# Patient Record
Sex: Male | Born: 1985 | Race: Black or African American | Hispanic: No | Marital: Single | Smoking: Current every day smoker
Health system: Southern US, Community
[De-identification: ages and names within clinical notes are randomized; demographics above are authoritative.]

## PROBLEM LIST (undated history)

## (undated) DIAGNOSIS — M069 Rheumatoid arthritis, unspecified: Secondary | ICD-10-CM

---

## 2013-03-29 ENCOUNTER — Emergency Department (HOSPITAL_COMMUNITY)
Admission: EM | Admit: 2013-03-29 | Discharge: 2013-03-29 | Discharge status: Against Medical Advice | Payer: Self-pay | Attending: Emergency Medicine | Admitting: Emergency Medicine

## 2013-03-29 ENCOUNTER — Encounter (HOSPITAL_COMMUNITY): Payer: Self-pay | Admitting: Emergency Medicine

## 2013-03-29 DIAGNOSIS — F17200 Nicotine dependence, unspecified, uncomplicated: Secondary | ICD-10-CM | POA: Insufficient documentation

## 2013-03-29 DIAGNOSIS — M545 Low back pain, unspecified: Secondary | ICD-10-CM | POA: Insufficient documentation

## 2013-03-29 NOTE — ED Notes (Signed)
Pt did not answer x 1 

## 2013-03-29 NOTE — ED Notes (Signed)
Pt did not answer x 3 

## 2013-03-29 NOTE — ED Notes (Signed)
Patient presents about a week ago he started having back pain in the top of his back and now to the lower back

## 2013-03-29 NOTE — ED Notes (Signed)
Pt did not answer x 2 

## 2013-05-20 ENCOUNTER — Encounter (HOSPITAL_BASED_OUTPATIENT_CLINIC_OR_DEPARTMENT_OTHER): Payer: Self-pay | Admitting: Emergency Medicine

## 2013-05-20 ENCOUNTER — Emergency Department (HOSPITAL_BASED_OUTPATIENT_CLINIC_OR_DEPARTMENT_OTHER)
Admission: EM | Admit: 2013-05-20 | Discharge: 2013-05-20 | Disposition: A | Discharge status: Home / Self Care | Payer: Self-pay | Attending: Emergency Medicine | Admitting: Emergency Medicine

## 2013-05-20 ENCOUNTER — Emergency Department (HOSPITAL_BASED_OUTPATIENT_CLINIC_OR_DEPARTMENT_OTHER): Payer: Self-pay

## 2013-05-20 DIAGNOSIS — Z79899 Other long term (current) drug therapy: Secondary | ICD-10-CM | POA: Insufficient documentation

## 2013-05-20 DIAGNOSIS — F17200 Nicotine dependence, unspecified, uncomplicated: Secondary | ICD-10-CM | POA: Insufficient documentation

## 2013-05-20 DIAGNOSIS — M25429 Effusion, unspecified elbow: Secondary | ICD-10-CM | POA: Insufficient documentation

## 2013-05-20 DIAGNOSIS — M25469 Effusion, unspecified knee: Secondary | ICD-10-CM | POA: Insufficient documentation

## 2013-05-20 DIAGNOSIS — M12839 Other specific arthropathies, not elsewhere classified, unspecified wrist: Secondary | ICD-10-CM | POA: Insufficient documentation

## 2013-05-20 DIAGNOSIS — M12869 Other specific arthropathies, not elsewhere classified, unspecified knee: Secondary | ICD-10-CM | POA: Insufficient documentation

## 2013-05-20 LAB — URINALYSIS, ROUTINE W REFLEX MICROSCOPIC
Bilirubin Urine: NEGATIVE
GLUCOSE, UA: NEGATIVE mg/dL
Hgb urine dipstick: NEGATIVE
Ketones, ur: NEGATIVE mg/dL
NITRITE: NEGATIVE
Protein, ur: NEGATIVE mg/dL
Specific Gravity, Urine: 1.024 (ref 1.005–1.030)
Urobilinogen, UA: 0.2 mg/dL (ref 0.0–1.0)
pH: 6 (ref 5.0–8.0)

## 2013-05-20 LAB — URINE MICROSCOPIC-ADD ON

## 2013-05-20 LAB — C-REACTIVE PROTEIN: CRP: 4.1 mg/dL — ABNORMAL HIGH (ref <0.60)

## 2013-05-20 LAB — SEDIMENTATION RATE: Sed Rate: 68 mm/hr — ABNORMAL HIGH (ref 0–16)

## 2013-05-20 MED ORDER — METHOCARBAMOL 500 MG PO TABS: 500.0000 mg | ORAL_TABLET | Freq: Two times a day (BID) | ORAL | Status: DC

## 2013-05-20 MED ORDER — IBUPROFEN 600 MG PO TABS: 600.0000 mg | ORAL_TABLET | Freq: Four times a day (QID) | ORAL | Status: DC | PRN

## 2013-05-20 MED ORDER — HYDROCODONE-ACETAMINOPHEN 5-325 MG PO TABS: 1.0000 | ORAL_TABLET | Freq: Four times a day (QID) | ORAL | Status: DC | PRN

## 2013-05-20 MED ORDER — IBUPROFEN 400 MG PO TABS
600.0000 mg | ORAL_TABLET | Freq: Once | ORAL | Status: AC
  Administered 2013-05-20: 600 mg via ORAL

## 2013-05-20 MED ORDER — IBUPROFEN 400 MG PO TABS
ORAL_TABLET | ORAL | Status: AC
  Filled 2013-05-20: qty 1

## 2013-05-20 MED ORDER — IBUPROFEN 200 MG PO TABS
ORAL_TABLET | ORAL | Status: AC
  Filled 2013-05-20: qty 1

## 2013-05-20 NOTE — Discharge Instructions (Signed)
Please see the doctor as we requested, for further diagnostic workup. Use RICE therapy until the (read below).   Knee Effusion The medical term for having fluid in your knee is effusion. This is often due to an internal derangement of the knee. This means something is wrong inside the knee. Some of the causes of fluid in the knee may be torn cartilage, a torn ligament, or bleeding into the joint from an injury. Your knee is likely more difficult to bend and move. This is often because there is increased pain and pressure in the joint. The time it takes for recovery from a knee effusion depends on different factors, including:   Type of injury.  Your age.  Physical and medical conditions.  Rehabilitation Strategies. How long you will be away from your normal activities will depend on what kind of knee problem you have and how much damage is present. Your knee has two types of cartilage. Articular cartilage covers the bone ends and lets your knee bend and move smoothly. Two menisci, thick pads of cartilage that form a rim inside the joint, help absorb shock and stabilize your knee. Ligaments bind the bones together and support your knee joint. Muscles move the joint, help support your knee, and take stress off the joint itself. CAUSES  Often an effusion in the knee is caused by an injury to one of the menisci. This is often a tear in the cartilage. Recovery after a meniscus injury depends on how much meniscus is damaged and whether you have damaged other knee tissue. Small tears may heal on their own with conservative treatment. Conservative means rest, limited weight bearing activity and muscle strengthening exercises. Your recovery may take up to 6 weeks.  TREATMENT  Larger tears may require surgery. Meniscus injuries may be treated during arthroscopy. Arthroscopy is a procedure in which your surgeon uses a small telescope like instrument to look in your knee. Your caregiver can make a more  accurate diagnosis (learning what is wrong) by performing an arthroscopic procedure. If your injury is on the inner margin of the meniscus, your surgeon may trim the meniscus back to a smooth rim. In other cases your surgeon will try to repair a damaged meniscus with stitches (sutures). This may make rehabilitation take longer, but may provide better long term result by helping your knee keep its shock absorption capabilities. Ligaments which are completely torn usually require surgery for repair. HOME CARE INSTRUCTIONS  Use crutches as instructed.  If a brace is applied, use as directed.  Once you are home, an ice pack applied to your swollen knee may help with discomfort and help decrease swelling.  Keep your knee raised (elevated) when you are not up and around or on crutches.  Only take over-the-counter or prescription medicines for pain, discomfort, or fever as directed by your caregiver.  Your caregivers will help with instructions for rehabilitation of your knee. This often includes strengthening exercises.  You may resume a normal diet and activities as directed. SEEK MEDICAL CARE IF:   There is increased swelling in your knee.  You notice redness, swelling, or increasing pain in your knee.  An unexplained oral temperature above 102 F (38.9 C) develops. SEEK IMMEDIATE MEDICAL CARE IF:   You develop a rash.  You have difficulty breathing.  You have any allergic reactions from medications you may have been given.  There is severe pain with any motion of the knee. MAKE SURE YOU:   Understand these instructions.  Will watch your condition.  Will get help right away if you are not doing well or get worse. Document Released: 06/09/2003 Document Revised: 06/11/2011 Document Reviewed: 08/13/2007 [facility] Patient Information 2014 [place], [place].  Elbow Effusion You have an elbow injury with an effusion. This means there is blood or other fluid in the elbow joint. Both  fractures and sprains of the elbow cab cause an effusion with swelling and pain. X-rays often show this swelling around the joint, but they may not show a fracture. The treatment for elbow sprains and minor fractures is to reduce swelling and pain. It rests the joint until movement is painless. Repeating the x-ray study in 1-2 weeks may show a minor fracture of the radius bone that was not visible on the initial x-rays. Most of the time a splint or sling is used for the first days or week after the injury. Apply ice packs to the elbow for 20-30 minutes every 2 hours for the next 2-3 days. Keep your elbow elevated above the level of your heart as much as possible until the pain and swelling are better. An elastic wrap may also be used to reduce swelling. Call your caregiver for follow-up care within one week.  The major issue with this condition is loss of elbow motion. In general, your caregiver will start you on motion exercises and may have you follow-up with a physical or hand therapist. SEEK MEDICAL CARE IF:   You develop a numb, cold, or pale forearm or hand. Document Released: 04/26/2004 Document Revised: 06/11/2011 Document Reviewed: 09/14/2008 [facility] Patient Information 2014 [place], [place].

## 2013-05-20 NOTE — ED Provider Notes (Signed)
CSN: [id number]     Arrival date & time 05/20/13  0009 History   First MD Initiated Contact with Patient 05/20/13 0118     Chief Complaint  Patient presents with  . Back Pain     (Consider location/radiation/quality/duration/timing/severity/associated sxs/prior Treatment) HPI Comments: Pt comes in with cc of back pain. Patient reports having no known medical hx. He ha been having 2 months of back pain, left sided, left elbow pain and swelling and left knee pain and swelling his knee pain was assessed at outside hospital, fluid analysis was normal, and he was asked to see an orthopedic doctor for further eval - which he has not been able to do. Pt has no personal hx of arthritis, no immediate fam hx of OA. No significant weight loss. Pt complains of stiffness to his joints, especially in the morning, or when he has rested for a while. No n/v/f/c.  Patient is a 28 y.o. male presenting with back pain. The history is provided by the patient.  Back Pain Associated symptoms: no abdominal pain, no chest pain, no dysuria and no fever     History reviewed. No pertinent past medical history. History reviewed. No pertinent past surgical history. No family history on file. History  Substance Use Topics  . Smoking status: Current Every Day Smoker  . Smokeless tobacco: Never Used  . Alcohol Use: Yes     Comment: ocassionally    Review of Systems  Constitutional: Positive for activity change. Negative for fever, chills, appetite change and unexpected weight change.  Respiratory: Negative for shortness of breath.   Cardiovascular: Negative for chest pain.  Gastrointestinal: Negative for abdominal pain.  Genitourinary: Negative for dysuria.  Musculoskeletal: Positive for arthralgias and back pain.  Skin: Negative for rash.      Allergies  Review of patient's allergies indicates no known allergies.  Home Medications   Current Outpatient Rx  Name  Route  Sig  Dispense  Refill  .  HYDROcodone-acetaminophen (NORCO/VICODIN) 5-325 MG per tablet   Oral   Take 1 tablet by mouth every 6 (six) hours as needed.   8 tablet   0   . ibuprofen (ADVIL,MOTRIN) 600 MG tablet   Oral   Take 1 tablet (600 mg total) by mouth every 6 (six) hours as needed.   30 tablet   0   . methocarbamol (ROBAXIN) 500 MG tablet   Oral   Take 1 tablet (500 mg total) by mouth 2 (two) times daily.   20 tablet   0    BP 116/73  Pulse 78  Temp(Src) 98.6 F (37 C) (Oral)  Resp 15  Ht 5\' 6"  (1.676 m)  Wt 150 lb (68.04 kg)  BMI 24.22 kg/m2  SpO2 99% Physical Exam  Vitals reviewed. Constitutional: He is oriented to person, place, and time. He appears well-developed.  HENT:  Head: Normocephalic and atraumatic.  Eyes: Conjunctivae and EOM are normal. Pupils are equal, round, and reactive to light.  Neck: Normal range of motion. Neck supple.  Cardiovascular: Normal rate and regular rhythm.   Pulmonary/Chest: Effort normal and breath sounds normal.  Abdominal: Soft. Bowel sounds are normal. He exhibits no distension. There is no tenderness. There is no rebound and no guarding.  Musculoskeletal:  Pt has no tenderness over the spine No step offs, no erythema. Able to discriminate between sharp and dull. Able to ambulate Pt has left sided back tenderness - from neck to lower flank region.  Left knee has effusion, i  can appreciate the integrity of quadriceps tendon on extension. Pt also has knee effusion on the left side. ROM for both the knee and elbow is quite intact - about 80-85% on the left side compared to the normal contralateral side with flexion and extension, and with pronation and supination. There is no warmth to touch and no erythema  Neurological: He is alert and oriented to person, place, and time.  Skin: Skin is warm.    ED Course  Procedures (including critical care time) Labs Review Labs Reviewed  URINALYSIS, ROUTINE W REFLEX MICROSCOPIC - Abnormal; Notable for the  following:    Leukocytes, UA SMALL (*)    All other components within normal limits  SEDIMENTATION RATE - Abnormal; Notable for the following:    Sed Rate 68 (*)    All other components within normal limits  URINE MICROSCOPIC-ADD ON  C-REACTIVE PROTEIN   Imaging Review Dg Elbow Complete Left  05/20/2013   CLINICAL DATA:  Left posterior elbow pain.  No known injury.  EXAM: LEFT ELBOW - COMPLETE 3+ VIEW  COMPARISON:  None.  FINDINGS: There is a left elbow joint effusion. No fracture, subluxation or dislocation. Soft tissues are intact.  IMPRESSION: Left elbow joint effusion without visible acute bony abnormality. If there is clinical concern for occult fracture, consider immobilization and repeat imaging if symptoms persist.   Electronically Signed   By: Charlett Nose M.D.   On: 05/20/2013 02:59   Dg Knee Complete 4 Views Left  05/20/2013   CLINICAL DATA:  Left knee pain.  EXAM: LEFT KNEE - COMPLETE 4+ VIEW  COMPARISON:  None.  FINDINGS: Suspect moderate joint effusion. No acute bony abnormality. Specifically, no fracture, subluxation, or dislocation. Soft tissues are intact.  IMPRESSION: Moderate joint effusion. No acute bony abnormality.   Electronically Signed   By: Charlett Nose M.D.   On: 05/20/2013 03:06    EKG Interpretation   None       MDM   Final diagnoses:  Joint effusion of elbow  Joint effusion of knee    Pt comes in with back pain, knee swelling and elbow swelling. There is stiffness to the joint as well, no other concerning constitutionals. Sx x 2 months. Pt has had arthrocentesis - neg results according to him.  I am not sure what the etiology is for the polyarthralgia's - but seems more of a systemic condition. I have ordered sed rat, crp, xrays - all for the benefit of the next provider. I have asked him to see Dr. Pearletha Forge, who is a [facility] trained Sport Med, doctor - who likely will have better insight on initial workup and referral.      Derwood Kaplan,  MD 05/20/13 [phone]

## 2013-05-20 NOTE — ED Notes (Signed)
Onset x 2 months  Awoke with back pain .  Has been seen for same.  Also had fld left knee  And pain left elbow

## 2013-05-22 ENCOUNTER — Ambulatory Visit: Payer: Self-pay | Admitting: Family Medicine

## 2013-06-05 ENCOUNTER — Other Ambulatory Visit: Payer: Self-pay | Admitting: Family Medicine

## 2013-06-05 ENCOUNTER — Encounter: Payer: Self-pay | Admitting: Family Medicine

## 2013-06-05 ENCOUNTER — Ambulatory Visit (INDEPENDENT_AMBULATORY_CARE_PROVIDER_SITE_OTHER): Payer: Self-pay | Admitting: Family Medicine

## 2013-06-05 VITALS — BP 138/81 | HR 91 | Ht 69.75 in | Wt 144.0 lb

## 2013-06-05 DIAGNOSIS — M25469 Effusion, unspecified knee: Secondary | ICD-10-CM

## 2013-06-05 DIAGNOSIS — M25462 Effusion, left knee: Secondary | ICD-10-CM

## 2013-06-05 LAB — SYNOVIAL CELL COUNT + DIFF, W/ CRYSTALS
CRYSTALS FLUID: NONE SEEN
EOSINOPHILS-SYNOVIAL: 0 % (ref 0–1)
LYMPHOCYTES-SYNOVIAL FLD: 14 % (ref 0–20)
Monocyte/Macrophage: 24 % — ABNORMAL LOW (ref 50–90)
NEUTROPHIL, SYNOVIAL: 62 % — AB (ref 0–25)

## 2013-06-05 MED ORDER — METHYLPREDNISOLONE ACETATE 40 MG/ML IJ SUSP
40.0000 mg | Freq: Once | INTRAMUSCULAR | Status: AC
  Administered 2013-06-05: 40 mg via INTRA_ARTICULAR

## 2013-06-05 MED ORDER — PREDNISONE (PAK) 10 MG PO TABS: ORAL_TABLET | ORAL | Status: DC

## 2013-06-05 NOTE — Patient Instructions (Signed)
We will contact you with lab results from today's aspiration. You were also given an injection into this knee. Prednisone dose pack x 6 days - day after this take ibuprofen 600mg  three times a day with food for pain and inflammation. Norco every 6 hours as needed for severe pain while waiting for other medicines to work (we don't refill this; no driving/working while taking this). Icing 15 minutes at a time 3-4 times a day. Further treatment will depend on the results.

## 2013-06-06 LAB — GLUCOSE, SYNOVIAL FLUID: Glucose, Synovial Fluid: 86 mg/dL

## 2013-06-08 ENCOUNTER — Encounter: Payer: Self-pay | Admitting: Family Medicine

## 2013-06-08 DIAGNOSIS — M25462 Effusion, left knee: Secondary | ICD-10-CM | POA: Insufficient documentation

## 2013-06-08 NOTE — Progress Notes (Addendum)
Patient ID: Tony Coffey, male   DOB: 04/26/1985, 28 y.o.   MRN: [id number]  PCP: No PCP Per Patient  Subjective:   HPI: Patient is a 28 y.o. male here for left knee pain.  Patient reports for at least several months, maybe longer he has had intermittent left knee pain and swelling. Been more consistent past three months though. [facility] but unsuccessful. Mom has gout and grandmother has Rheumatoid arthritis. Has tried icing, ibuprofen, icy hot. Using an immobilizer and crutches. He also has left elbow pain, swelling, and neck and back pain.  History reviewed. No pertinent past medical history.  Current Outpatient Prescriptions on File Prior to Visit  Medication Sig Dispense Refill  . HYDROcodone-acetaminophen (NORCO/VICODIN) 5-325 MG per tablet Take 1 tablet by mouth every 6 (six) hours as needed.  8 tablet  0  . ibuprofen (ADVIL,MOTRIN) 600 MG tablet Take 1 tablet (600 mg total) by mouth every 6 (six) hours as needed.  30 tablet  0  . methocarbamol (ROBAXIN) 500 MG tablet Take 1 tablet (500 mg total) by mouth 2 (two) times daily.  20 tablet  0   No current facility-administered medications on file prior to visit.    History reviewed. No pertinent past surgical history.  No Known Allergies  History   Social History  . Marital Status: Single    Spouse Name: N/A    Number of Children: N/A  . Years of Education: N/A   Occupational History  . Not on file.   Social History Main Topics  . Smoking status: Current Every Day Smoker -- 1.00 packs/day    Types: Cigarettes  . Smokeless tobacco: Never Used  . Alcohol Use: Yes     Comment: ocassionally  . Drug Use: No  . Sexual Activity: Yes   Other Topics Concern  . Not on file   Social History Narrative  . No narrative on file    History reviewed. No pertinent family history.  BP 138/81  Pulse 91  Ht 5' 9.75" (1.772 m)  Wt 144 lb (65.318 kg)  BMI 20.80 kg/m2  Review of Systems: See HPI  above.    Objective:  Physical Exam:  Gen: NAD  Left knee: Mod effusion and warmth.  No redness.  No other deformity. Diffuse anterior tenderness. ROM 0 - 90 degrees. Negative ant/post drawers. Negative valgus/varus testing. Negative lachmanns. Negative mcmurrays, apleys, patellar apprehension. NV intact distally.  Left elbow: No gross deformity, swelling bruising.  Mild warmth. No focal tenderness. Collateral ligaments intact.  NVI distally.    Assessment & Plan:  1. Left knee effusion - Aspirated today and sent for analysis.  He has multiple joints involved though left knee is the predominant complaint.  Fluid cloudy, consistent with either gout or an inflammatory arthropathy.  Unfortunately lack of insurance coverage a major barrier to care.  Will try to at least get him cone coverage, establish with a PCP and try to get in with rheumatology if needed.  Prednisone dose pack for other joints as well.  Icing, norco as needed (no refills).  After informed written consent patient was lying supine on exam table.  Left knee was prepped with alcohol swab.  Utilizing superolateral approach, 3 mL of marcaine was used for local anesthesia.  Then using an 18g needle on 60cc syringe, 35 mL of cloudy straw-colored fluid was aspirated from right knee.  Knee was then injected with 3:1 marcaine:depomedrol.  Patient tolerated procedure well without immediate complications  Addendum:  Synovial fluid analysis reviewed and discussed with patient.  Unfortunately no cell count available as this clotted but it does look inflammatory with increase neutrophil percentage, no crystals.  He does feel significantly better with prednisone and the injection.  Insurance doesn't kick in for another 5 months.  Advised we may at some point have to repeat dose pack if symptoms worsen again.  Otherwise follow-up as needed.

## 2013-06-08 NOTE — Assessment & Plan Note (Signed)
Aspirated today and sent for analysis.  He has multiple joints involved though left knee is the predominant complaint.  Fluid cloudy, consistent with either gout or an inflammatory arthropathy.  Unfortunately lack of insurance coverage a major barrier to care.  Will try to at least get him cone coverage, establish with a PCP and try to get in with rheumatology if needed.  Prednisone dose pack for other joints as well.  Icing, norco as needed (no refills).  After informed written consent patient was lying supine on exam table.  Left knee was prepped with alcohol swab.  Utilizing superolateral approach, 3 mL of marcaine was used for local anesthesia.  Then using an 18g needle on 60cc syringe, 35 mL of cloudy straw-colored fluid was aspirated from right knee.  Knee was then injected with 3:1 marcaine:depomedrol.  Patient tolerated procedure well without immediate complications

## 2013-06-09 LAB — BODY FLUID CULTURE
GRAM STAIN: NONE SEEN
Organism ID, Bacteria: NO GROWTH

## 2013-07-08 ENCOUNTER — Encounter: Payer: Self-pay | Admitting: Family Medicine

## 2013-07-08 ENCOUNTER — Ambulatory Visit (INDEPENDENT_AMBULATORY_CARE_PROVIDER_SITE_OTHER): Payer: Self-pay | Admitting: Family Medicine

## 2013-07-08 VITALS — BP 128/84 | HR 84 | Ht 69.0 in | Wt 144.0 lb

## 2013-07-08 DIAGNOSIS — M25462 Effusion, left knee: Secondary | ICD-10-CM

## 2013-07-08 DIAGNOSIS — M25469 Effusion, unspecified knee: Secondary | ICD-10-CM

## 2013-07-09 ENCOUNTER — Encounter: Payer: Self-pay | Admitting: Family Medicine

## 2013-07-09 NOTE — Progress Notes (Signed)
Patient ID: Tony Coffey, male   DOB: Feb 20, 1986, 28 y.o.   MRN: [id number]  PCP: No PCP Per Patient  Subjective:   HPI: Patient is a 28 y.o. male here for left knee pain.  3/6: Patient reports for at least several months, maybe longer he has had intermittent left knee pain and swelling. Been more consistent past three months though. T[facility] but unsuccessful. Mom has gout and grandmother has Rheumatoid arthritis. Has tried icing, ibuprofen, icy hot. Using an immobilizer and crutches. He also has left elbow pain, swelling, and neck and back pain.  4/8: Patient returns as pain and swelling recurred in left knee. Injection did help tremendously for a few weeks. He has applied for medicaid and waiting for this to be approved to get in with rheumatology.  History reviewed. No pertinent past medical history.  Current Outpatient Prescriptions on File Prior to Visit  Medication Sig Dispense Refill  . ibuprofen (ADVIL,MOTRIN) 600 MG tablet Take 1 tablet (600 mg total) by mouth every 6 (six) hours as needed.  30 tablet  0   No current facility-administered medications on file prior to visit.    History reviewed. No pertinent past surgical history.  No Known Allergies  History   Social History  . Marital Status: Single    Spouse Name: N/A    Number of Children: N/A  . Years of Education: N/A   Occupational History  . Not on file.   Social History Main Topics  . Smoking status: Current Every Day Smoker -- 1.00 packs/day    Types: Cigarettes  . Smokeless tobacco: Never Used  . Alcohol Use: Yes     Comment: ocassionally  . Drug Use: No  . Sexual Activity: Yes   Other Topics Concern  . Not on file   Social History Narrative  . No narrative on file    History reviewed. No pertinent family history.  BP 128/84  Pulse 84  Ht 5\' 9"  (1.753 m)  Wt 144 lb (65.318 kg)  BMI 21.26 kg/m2  Review of Systems: See HPI above.    Objective:  Physical  Exam:  Gen: NAD  Left knee: Mild effusion and warmth.  No redness.  No other deformity. Diffuse anterior tenderness. ROM 0 - 90 degrees. Negative ant/post drawers. Negative valgus/varus testing. Negative lachmanns. Negative mcmurrays, apleys, patellar apprehension. NV intact distally.    Assessment & Plan:  1. Left knee effusion - Along with other arthalgias, positive family history for RA, suspect this is his diagnosis.  Waiting on insurance coverage to kick in to see a rheumatologist for further evaluation, definitive treatment.  Knee injected today (not felt to have enough of an effusion to aspirate today.  Prior synovial analysis had increased neutrophil percentage without crystals (clotted so couldn't get a cell count).  Significant debility at work - will be out for next 2 months though could return sooner if able to get in with rheumatology or this injection provides better benefit than last injection.  After informed written consent patient was lying supine on exam table.  Left knee was prepped with alcohol swab.  Utilizing superolateral approach, left knee was injected with 3:1 marcaine:depomedrol.  Patient tolerated procedure well without immediate complications

## 2013-07-09 NOTE — Assessment & Plan Note (Signed)
Along with other arthalgias, positive family history for RA, suspect this is his diagnosis.  Waiting on insurance coverage to kick in to see a rheumatologist for further evaluation, definitive treatment.  Knee injected today (not felt to have enough of an effusion to aspirate today.  Prior synovial analysis had increased neutrophil percentage without crystals (clotted so couldn't get a cell count).  Significant debility at work - will be out for next 2 months though could return sooner if able to get in with rheumatology or this injection provides better benefit than last injection.  After informed written consent patient was lying supine on exam table.  Left knee was prepped with alcohol swab.  Utilizing superolateral approach, left knee was injected with 3:1 marcaine:depomedrol.  Patient tolerated procedure well without immediate complications

## 2013-09-07 ENCOUNTER — Encounter: Payer: Self-pay | Admitting: Family Medicine

## 2013-09-07 ENCOUNTER — Ambulatory Visit (INDEPENDENT_AMBULATORY_CARE_PROVIDER_SITE_OTHER): Payer: Self-pay | Admitting: Family Medicine

## 2013-09-07 VITALS — BP 126/89 | Ht 68.0 in | Wt 145.0 lb

## 2013-09-07 DIAGNOSIS — M25469 Effusion, unspecified knee: Secondary | ICD-10-CM

## 2013-09-07 DIAGNOSIS — M25462 Effusion, left knee: Secondary | ICD-10-CM

## 2013-09-07 NOTE — Patient Instructions (Signed)
Call me when your insurance goes through so we can get you in with rheumatology. Otherwise follow up with me as needed. i would continue to take the naproxen twice a day regularly until you see rheumatology.

## 2013-09-07 NOTE — Assessment & Plan Note (Signed)
Resolved at this point.  As noted previously along with other arthalgias, positive family history for RA, suspect this is his diagnosis.  Still waiting on insurance coverage to kick in to see a rheumatologist for further evaluation, definitive treatment.  S/p knee injection which helped.  Prior synovial analysis had increased neutrophil percentage without crystals (clotted so couldn't get a cell count).  Believe he can return to work without restrictions at this point.  Call when insurance goes through to get in with rheumatology.

## 2013-09-07 NOTE — Progress Notes (Signed)
Patient ID: Tony Coffey, male   DOB: 1985-07-08, 28 y.o.   MRN: [id number]  PCP: No PCP Per Patient  Subjective:   HPI: Patient is a 28 y.o. male here for left knee pain.  3/6: Patient reports for at least several months, maybe longer he has had intermittent left knee pain and swelling. Been more consistent past three months though. Tried aspiration at [facility] but unsuccessful. Mom has gout and grandmother has Rheumatoid arthritis. Has tried icing, ibuprofen, icy hot. Using an immobilizer and crutches. He also has left elbow pain, swelling, and neck and back pain.  4/8: Patient returns as pain and swelling recurred in left knee. Injection did help tremendously for a few weeks. He has applied for medicaid and waiting for this to be approved to get in with rheumatology.  6/8: Patient reports he feels much better. No longer with knee swelling, pain. Wants to return to work. Still does not have insurance but working on this. Injection helped him quite a bit. Taking naproxen regularly.  History reviewed. No pertinent past medical history.  No current outpatient prescriptions on file prior to visit.   No current facility-administered medications on file prior to visit.    History reviewed. No pertinent past surgical history.  No Known Allergies  History   Social History  . Marital Status: Single    Spouse Name: N/A    Number of Children: N/A  . Years of Education: N/A   Occupational History  . Not on file.   Social History Main Topics  . Smoking status: Current Every Day Smoker -- 1.00 packs/day    Types: Cigarettes  . Smokeless tobacco: Never Used  . Alcohol Use: Yes     Comment: ocassionally  . Drug Use: No  . Sexual Activity: Yes   Other Topics Concern  . Not on file   Social History Narrative  . No narrative on file    History reviewed. No pertinent family history.  BP 126/89  Ht 5\' 8"  (1.727 m)  Wt 145 lb (65.772 kg)  BMI 22.05  kg/m2  Review of Systems: See HPI above.    Objective:  Physical Exam:  Gen: NAD  Left knee: No effusion, warmth, redness.  No other deformity. No tenderness. FROM. Negative ant/post drawers. Negative valgus/varus testing. Negative lachmanns. Negative mcmurrays, apleys, patellar apprehension. NV intact distally.    Assessment & Plan:  1. Left knee effusion - Resolved at this point.  As noted previously along with other arthalgias, positive family history for RA, suspect this is his diagnosis.  Still waiting on insurance coverage to kick in to see a rheumatologist for further evaluation, definitive treatment.  S/p knee injection which helped.  Prior synovial analysis had increased neutrophil percentage without crystals (clotted so couldn't get a cell count).  Believe he can return to work without restrictions at this point.  Call when insurance goes through to get in with rheumatology.

## 2014-01-14 ENCOUNTER — Ambulatory Visit: Payer: Self-pay | Admitting: Family Medicine

## 2014-01-20 ENCOUNTER — Ambulatory Visit: Payer: Self-pay | Admitting: Family Medicine

## 2014-01-25 ENCOUNTER — Ambulatory Visit: Payer: Self-pay | Admitting: Family Medicine

## 2014-01-25 ENCOUNTER — Ambulatory Visit (INDEPENDENT_AMBULATORY_CARE_PROVIDER_SITE_OTHER): Payer: Self-pay | Admitting: Family Medicine

## 2014-01-25 ENCOUNTER — Encounter: Payer: Self-pay | Admitting: Family Medicine

## 2014-01-25 VITALS — BP 147/99 | HR 70 | Ht 69.0 in | Wt 145.0 lb

## 2014-01-25 DIAGNOSIS — M25562 Pain in left knee: Secondary | ICD-10-CM

## 2014-01-25 DIAGNOSIS — M25462 Effusion, left knee: Secondary | ICD-10-CM

## 2014-01-25 MED ORDER — METHYLPREDNISOLONE ACETATE 40 MG/ML IJ SUSP
40.0000 mg | Freq: Once | INTRAMUSCULAR | Status: AC
  Administered 2014-01-25: 40 mg via INTRA_ARTICULAR

## 2014-01-25 NOTE — Patient Instructions (Signed)
Drop your insurance card off when this comes in and we will refer you to rheumatology (tell lynette about the rheumatology referral when you drop the card off). Let me know when you do if you want to do physical therapy for your back and your leg in the meantime. Stop the naproxen. Ok to take tylenol 500mg  1-2 tabs three times a day as needed for pain. Hopefully the shot takes care of your pain within a few days anyway.

## 2014-01-28 ENCOUNTER — Encounter: Payer: Self-pay | Admitting: Family Medicine

## 2014-01-28 NOTE — Progress Notes (Signed)
Patient ID: Tony Coffey, male   DOB: 1985-12-07, 28 y.o.   MRN: [id number]  PCP: No PCP Per Patient  Subjective:   HPI: Patient is a 28 y.o. male here for left knee pain.  3/6: Patient reports for at least several months, maybe longer he has had intermittent left knee pain and swelling. Been more consistent past three months though. T[facility] but unsuccessful. Mom has gout and grandmother has Rheumatoid arthritis. Has tried icing, ibuprofen, icy hot. Using an immobilizer and crutches. He also has left elbow pain, swelling, and neck and back pain.  4/8: Patient returns as pain and swelling recurred in left knee. Injection did help tremendously for a few weeks. He has applied for medicaid and waiting for this to be approved to get in with rheumatology.  6/8: Patient reports he feels much better. No longer with knee swelling, pain. Wants to return to work. Still does not have insurance but working on this. Injection helped him quite a bit. Taking naproxen regularly.  10/26: Patient reports left knee has started bothering him again. Went to [facility] this month - told by MD after x-rays that his knee was sprained. Taking naproxen. Getting pain when walking. + swelling Just obtained insurance but cards went to wrong address.  History reviewed. No pertinent past medical history.  Current Outpatient Prescriptions on File Prior to Visit  Medication Sig Dispense Refill  . naproxen (NAPROSYN) 500 MG tablet Take 500 mg by mouth 2 (two) times daily with a meal.       No current facility-administered medications on file prior to visit.    History reviewed. No pertinent past surgical history.  No Known Allergies  History   Social History  . Marital Status: Single    Spouse Name: N/A    Number of Children: N/A  . Years of Education: N/A   Occupational History  . Not on file.   Social History Main Topics  . Smoking status: Current Every  Day Smoker    Types: Cigarettes  . Smokeless tobacco: Never Used     Comment: 7 cigarettes per day  . Alcohol Use: Yes     Comment: ocassionally  . Drug Use: No  . Sexual Activity: Yes   Other Topics Concern  . Not on file   Social History Narrative  . No narrative on file    No family history on file.  BP 147/99  Pulse 70  Ht 5\' 9"  (1.753 m)  Wt 145 lb (65.772 kg)  BMI 21.40 kg/m2  Review of Systems: See HPI above.    Objective:  Physical Exam:  Gen: NAD  Left knee: Mild effusion.  No warmth, redness.  Quad atrophy.  No other deformity. TTP minimally bilateral joint lines. FROM. Negative ant/post drawers. Negative valgus/varus testing. Negative lachmanns. Negative mcmurrays, apleys, patellar apprehension. NV intact distally.    Assessment & Plan:  1. Left knee effusion - Recurred though mild.  Discussed options and repeated intraarticular injection today.  Advised to call us when insurance card comes, drop this off so we can refer him to rheumatology.  Consider physical therapy for strengthening (decreased quad strength and bulk related to this knee swelling and pain).  As noted previously along with other arthalgias, positive family history for RA, suspect this is his diagnosis.  Prior synovial analysis had increased neutrophil percentage without crystals (clotted so couldn't get a cell count).   After informed written consent, patient was seated on exam table.  Left knee was prepped with alcohol swab and utilizing superomedial approach, patient's left knee was injected intraarticularly with 3:1 marcaine: depomedrol. Patient tolerated the procedure well without immediate complications.

## 2014-01-28 NOTE — Assessment & Plan Note (Signed)
Recurred though mild.  Discussed options and repeated intraarticular injection today.  Advised to call us when insurance card comes, drop this off so we can refer him to rheumatology.  Consider physical therapy for strengthening (decreased quad strength and bulk related to this knee swelling and pain).  As noted previously along with other arthalgias, positive family history for RA, suspect this is his diagnosis.  Prior synovial analysis had increased neutrophil percentage without crystals (clotted so couldn't get a cell count).   After informed written consent, patient was seated on exam table. Left knee was prepped with alcohol swab and utilizing superomedial approach, patient's left knee was injected intraarticularly with 3:1 marcaine: depomedrol. Patient tolerated the procedure well without immediate complications.

## 2017-04-24 ENCOUNTER — Other Ambulatory Visit: Payer: Self-pay

## 2017-04-24 ENCOUNTER — Encounter (HOSPITAL_BASED_OUTPATIENT_CLINIC_OR_DEPARTMENT_OTHER): Payer: Self-pay

## 2017-04-24 ENCOUNTER — Emergency Department (HOSPITAL_BASED_OUTPATIENT_CLINIC_OR_DEPARTMENT_OTHER)
Admission: EM | Admit: 2017-04-24 | Discharge: 2017-04-24 | Disposition: A | Discharge status: Home / Self Care | Payer: Self-pay | Attending: Emergency Medicine | Admitting: Emergency Medicine

## 2017-04-24 ENCOUNTER — Emergency Department (HOSPITAL_BASED_OUTPATIENT_CLINIC_OR_DEPARTMENT_OTHER): Payer: Self-pay

## 2017-04-24 DIAGNOSIS — M1711 Unilateral primary osteoarthritis, right knee: Secondary | ICD-10-CM

## 2017-04-24 DIAGNOSIS — F17210 Nicotine dependence, cigarettes, uncomplicated: Secondary | ICD-10-CM | POA: Insufficient documentation

## 2017-04-24 HISTORY — DX: Rheumatoid arthritis, unspecified: M06.9

## 2017-04-24 MED ORDER — METHYLPREDNISOLONE SODIUM SUCC 125 MG IJ SOLR
125.0000 mg | Freq: Once | INTRAMUSCULAR | Status: AC
  Administered 2017-04-24: 125 mg via INTRAMUSCULAR
  Filled 2017-04-24: qty 2

## 2017-04-24 MED ORDER — HYDROCODONE-ACETAMINOPHEN 5-325 MG PO TABS: 2.0000 | ORAL_TABLET | ORAL | 0 refills | Status: AC | PRN

## 2017-04-24 MED ORDER — HYDROCODONE-ACETAMINOPHEN 5-325 MG PO TABS
1.0000 | ORAL_TABLET | Freq: Once | ORAL | Status: AC
  Administered 2017-04-24: 1 via ORAL
  Filled 2017-04-24: qty 1

## 2017-04-24 NOTE — ED Triage Notes (Signed)
C/o pain,swelling to right knee x 1 week-denies injury-NAD-presents to triage in w/c

## 2017-04-24 NOTE — ED Notes (Signed)
Patient educated about not driving or performing other critical tasks (such as operating heavy machinery, caring for infant/toddler/child) due to sedative nature of narcotic medications received while in the ED.  Pt/caregiver verbalized understanding.   

## 2017-04-24 NOTE — ED Provider Notes (Signed)
[facility] Provider Note: Lowella DellJ. Lane Britania Shreeve, MD, FACEP  CSN: [id number] MRN: [id number] ARRIVAL: 04/24/17 at 2123 ROOM: MH07/MH07   CHIEF COMPLAINT  Knee Pain   HISTORY OF PRESENT ILLNESS  04/24/17 11:09 PM Tony Coffey is a 32 y.o. male with a history of rheumatoid arthritis not currently on any medications.  He has had no flareups in about 2 years.  He is here with a one-week history of pain and swelling in his right knee.  He rates the pain as a 9 out of 10, worse with attempted range of motion.  Range of motion is limited due to pain and stiffness.  He has been taking naproxen without relief of the pain.  He is not having pain in other joints.  [place] with [place] [facility] [place] substances [place] reveals the patient has received no opioid prescriptions in the past 2 years.   Past Medical History:  Diagnosis Date  . RA (rheumatoid arthritis) (HCC)     History reviewed. No pertinent surgical history.  No family history on file.  Social History   Tobacco Use  . Smoking status: Current Every Day Smoker    Types: Cigarettes  . Smokeless tobacco: Never Used  . Tobacco comment: 7 cigarettes per day  Substance Use Topics  . Alcohol use: Yes    Comment: occ  . Drug use: No    Prior to Admission medications   Not on File    Allergies Patient has no known allergies.   REVIEW OF SYSTEMS  Negative except as noted here or in the History of Present Illness.   PHYSICAL EXAMINATION  Initial Vital Signs Blood pressure 137/82, pulse 90, temperature 98.9 F (37.2 C), temperature source Oral, resp. rate 16, height 5\' 9"  (1.753 m), weight 70.3 kg (155 lb), SpO2 100 %.  Examination General: Well-developed, well-nourished male in no acute distress; appearance consistent with age of record HENT: normocephalic; atraumatic Eyes: pupils equal, round and reactive to light; extraocular muscles intact Neck: supple Heart: regular rate and  rhythm Lungs: clear to auscultation bilaterally Abdomen: soft; nondistended; nontender; bowel sounds present Extremities: No deformity; pulses normal; tenderness and effusion of right knee with pain on attempted range of motion, range of motion limited due to pain and stiffness, no erythema or warmth Neurologic: Awake, alert and oriented; motor function intact in all extremities and symmetric; no facial droop Skin: Warm and dry Psychiatric: Normal mood and affect   RESULTS  Summary of this visit's results, reviewed by myself:   EKG Interpretation  Date/Time:    Ventricular Rate:    PR Interval:    QRS Duration:   QT Interval:    QTC Calculation:   R Axis:     Text Interpretation:        Laboratory Studies: No results found for this or any previous visit (from the past 24 hour(s)). Imaging Studies: Dg Knee Complete 4 Views Right  Result Date: 04/24/2017 CLINICAL DATA:  Initial evaluation for acute right knee pain for 1 week. History of rheumatoid arthritis. EXAM: RIGHT KNEE - COMPLETE 4+ VIEW COMPARISON:  None. FINDINGS: No acute fracture or dislocation. Joint spaces are well maintained without evidence for significant degenerative or erosive arthropathy. Small moderate joint effusion within the suprapatellar recess. Osseous mineralization normal. No acute soft tissue abnormality. IMPRESSION: 1. No acute osseous abnormality about the right knee. 2. Small to moderate joint effusion within the suprapatellar recess. Electronically Signed   By: Rise MuBenjamin  McClintock M.D.   On:  04/24/2017 22:14    ED COURSE  Nursing notes and initial vitals signs, including pulse oximetry, reviewed.  Vitals:   04/24/17 2138  BP: 137/82  Pulse: 90  Resp: 16  Temp: 98.9 F (37.2 C)  TempSrc: Oral  SpO2: 100%  Weight: 70.3 kg (155 lb)  Height: 5\' 9"  (1.753 m)   The patient's effusion is small and I do not believe that arthrocentesis would be significantly therapeutic at this time.  He has seen  Dr. Pearletha Forge before for steroid injection in his left knee and we will refer him back for treatment of the right knee.  We will provide a knee immobilizer, systemic steroids and analgesics in the meantime.  PROCEDURES    ED DIAGNOSES     ICD-10-CM   1. 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

## 2017-04-26 ENCOUNTER — Ambulatory Visit: Payer: Self-pay | Admitting: Family Medicine

## 2017-04-27 ENCOUNTER — Emergency Department (HOSPITAL_COMMUNITY)
Admission: EM | Admit: 2017-04-27 | Discharge: 2017-04-28 | Disposition: A | Discharge status: Home / Self Care | Payer: Self-pay | Attending: Emergency Medicine | Admitting: Emergency Medicine

## 2017-04-27 ENCOUNTER — Other Ambulatory Visit: Payer: Self-pay

## 2017-04-27 ENCOUNTER — Emergency Department (HOSPITAL_COMMUNITY): Admission: EM | Admit: 2017-04-27 | Discharge: 2017-04-27 | Discharge status: Absent without leave | Payer: Self-pay

## 2017-04-27 ENCOUNTER — Encounter (HOSPITAL_COMMUNITY): Payer: Self-pay | Admitting: Emergency Medicine

## 2017-04-27 DIAGNOSIS — M25461 Effusion, right knee: Secondary | ICD-10-CM | POA: Insufficient documentation

## 2017-04-27 DIAGNOSIS — F17210 Nicotine dependence, cigarettes, uncomplicated: Secondary | ICD-10-CM | POA: Insufficient documentation

## 2017-04-27 DIAGNOSIS — M25561 Pain in right knee: Secondary | ICD-10-CM

## 2017-04-27 LAB — CBG MONITORING, ED: GLUCOSE-CAPILLARY: 75 mg/dL (ref 65–99)

## 2017-04-27 MED ORDER — LIDOCAINE HCL 1 % IJ SOLN
30.0000 mL | Freq: Once | INTRAMUSCULAR | Status: AC
  Administered 2017-04-27: 30 mL
  Filled 2017-04-27: qty 40

## 2017-04-27 MED ORDER — OXYCODONE-ACETAMINOPHEN 5-325 MG PO TABS
1.0000 | ORAL_TABLET | Freq: Once | ORAL | Status: AC
  Administered 2017-04-27: 1 via ORAL
  Filled 2017-04-27: qty 1

## 2017-04-27 MED ORDER — KETOROLAC TROMETHAMINE 60 MG/2ML IM SOLN
30.0000 mg | Freq: Once | INTRAMUSCULAR | Status: AC
  Administered 2017-04-27: 30 mg via INTRAMUSCULAR
  Filled 2017-04-27: qty 2

## 2017-04-27 NOTE — ED Triage Notes (Signed)
Reports pain and swelling to right knee for over a week. Unable to bend the knee at this time. Reports that he has RA. Was seen at [facility] 3 days ago and received hydrocodone and a shot of prednisone. He reports xray's taken showed fluid on the knee. No other complaints and no other joints are involved. Denies injury or fall.

## 2017-04-27 NOTE — ED Provider Notes (Signed)
   [facility]   CSN: [id number] Arrival date & time: 04/27/17  1955     History   Chief Complaint No chief complaint on file.   HPI Tony Coffey is a 32 y.o. male with hx of RA who presents to the ED for knee pain and swelling. The pain is located in the right knee. The pain started over a week ago and has gotten worse. [facility] and given knee immobilizer, hydrocodone and injection of prednisone. He had x-rays that showed joint effusion baaut no fracture or dislocation. Patient plans to f/u with ortho. Patient here tonight with increased pain and swelling.   HPI  Past Medical History:  Diagnosis Date  . RA (rheumatoid arthritis) [facility](HCC)     Patient Active Problem List   Diagnosis Date Noted  . Knee effusion, left 06/08/2013    History reviewed. No pertinent surgical history.     Home Medications    Prior to Admission medications   Medication Sig Start Date End Date Taking? Authorizing Provider  HYDROcodone-acetaminophen (NORCO) 5-325 MG tablet Take 2 tablets by mouth every 4 (four) hours as needed (for pain). 04/24/17   Molpus, John, MD    Family History No family history on file.  Social History Social History   Tobacco Use  . Smoking status: Current Every Day Smoker    Types: Cigarettes  . Smokeless tobacco: Never Used  . Tobacco comment: 7 cigarettes per day  Substance Use Topics  . Alcohol use: Yes    Comment: occ  . Drug use: No     Allergies   Patient has no known allergies.   Review of Systems Review of Systems  Musculoskeletal: Positive for arthralgias.       Right knee pain and swelling  All other systems reviewed and are negative.    Physical Exam Updated Vital Signs BP (!) 142/92 (BP Location: Right Arm)   Pulse 90   Temp 98.3 F (36.8 C) (Oral)   SpO2 100%   Physical Exam  Constitutional: He appears well-developed and well-nourished. No distress.    HENT:  Head: Normocephalic and atraumatic.  Eyes: EOM are normal.  Neck: Neck supple.  Cardiovascular: Normal rate.  Pulmonary/Chest: Effort normal.  Musculoskeletal:       Right knee: He exhibits decreased range of motion, swelling and effusion. Tenderness found.  Increased warmth to right knee. Limited range of motion due to swelling and pain. Pedal pulse 2+.  Neurological: He is alert.  Skin: Skin is warm and dry.  Nursing note and vitals reviewed.    ED Treatments / Results  Labs (all labs ordered are listed, but only abnormal results are displayed) Labs Reviewed - No data to display  Radiology No results found.  Procedures Procedures (including critical care time)  Medications Ordered in ED Medications - No data to display   Initial Impression / Assessment and Plan / ED Course  I have reviewed the triage vital signs and the nursing notes.   Final Clinical Impressions(s) / ED Diagnoses  32 y.o. male with swelling and pain to the right knee for over a week. [facility] 10:00 pm. I discussed with patient continued care.   ED Discharge Orders    None       Kerrie Buffaloeese, Hope MarionM, [place] 04/27/17 2217    Tegeler, Canary Brimhristopher J, MD 04/27/17 [phone]

## 2017-04-27 NOTE — ED Provider Notes (Signed)
Patient signed out to me pending right knee aspiration to rule out septic joint.   Physical Exam  BP (!) 142/92 (BP Location: Right Arm)   Pulse 90   Temp 98.3 F (36.8 C) (Oral)   SpO2 100%   Physical Exam  ED Course/Procedures     .Joint Aspiration/Arthrocentesis Date/Time: 04/27/2017 10:39 PM Performed by: Roxy HorsemanBrowning, Anayeli Arel, PA-C Authorized by: Roxy HorsemanBrowning, Toluwani Yadav, PA-C   Consent:    Consent obtained:  Verbal   Consent given by:  Patient   Risks discussed:  Bleeding, infection, pain, incomplete drainage and nerve damage   Alternatives discussed:  No treatment Universal protocol:    Procedure explained and questions answered to patient or proxy's satisfaction: yes     Relevant documents present and verified: yes     Test results available and properly labeled: yes     Imaging studies available: yes     Required blood products, implants, devices, and special equipment available: yes     Site/side marked: yes     Immediately prior to procedure, a time out was called: yes     Patient identity confirmed:  Verbally with patient Location:    Location:  Knee   Knee:  R knee Anesthesia (see MAR for exact dosages):    Anesthesia method:  Local infiltration   Local anesthetic:  Lidocaine 1% w/o epi Procedure details:    Preparation: Patient was prepped and draped in usual sterile fashion     Needle gauge:  18 G   Ultrasound guidance: no     Approach:  Lateral   Aspirate amount:  40 ml   Aspirate characteristics:  Blood-tinged   Steroid injected: no     Specimen collected: yes   Post-procedure details:    Dressing:  Adhesive bandage   Patient tolerance of procedure:  Tolerated well, no immediate complications      MDM   Right knee synovial fluid is remarkable for 12,300 white blood cells, no crystals were seen, no organisms are seen, there are both PMN and mononuclear cells seen.  Will treat for inflammatory type arthritis with prednisone.  This is consistent with his  hx of RA.      Roxy HorsemanBrowning, Safi Culotta, PA-C 04/28/17 [id number]    Tegeler, Canary Brimhristopher J, MD 04/28/17 [phone]

## 2017-04-28 LAB — SYNOVIAL CELL COUNT + DIFF, W/ CRYSTALS
CRYSTALS FLUID: NONE SEEN
Eosinophils-Synovial: 0 % (ref 0–1)
LYMPHOCYTES-SYNOVIAL FLD: 11 % (ref 0–20)
MONOCYTE-MACROPHAGE-SYNOVIAL FLUID: 34 % — AB (ref 50–90)
Neutrophil, Synovial: 55 % — ABNORMAL HIGH (ref 0–25)
WBC, Synovial: 12300 /mm3 — ABNORMAL HIGH (ref 0–200)

## 2017-04-28 MED ORDER — PREDNISONE 20 MG PO TABS: 40.0000 mg | ORAL_TABLET | Freq: Every day | ORAL | 0 refills | Status: DC

## 2017-04-28 NOTE — ED Notes (Addendum)
WBC presents, polynuclear & mononuclear No organisms seen   Reported to Molly Maduroobert, ED PA

## 2017-04-29 ENCOUNTER — Encounter: Payer: Self-pay | Admitting: Family Medicine

## 2017-04-29 ENCOUNTER — Ambulatory Visit (INDEPENDENT_AMBULATORY_CARE_PROVIDER_SITE_OTHER): Payer: Self-pay | Admitting: Family Medicine

## 2017-04-29 DIAGNOSIS — M25561 Pain in right knee: Secondary | ICD-10-CM

## 2017-04-29 LAB — GLUCOSE, BODY FLUID OTHER: Glucose, Body Fluid Other: 55 mg/dL

## 2017-04-29 NOTE — Patient Instructions (Signed)
You have a flare of your rheumatoid arthritis. Take the prednisone for 5 days - it's ok for you to take 1 more pill today;  Then you would take 2 tablets each the next 3 days, 1 tablet the final day. Hydrocodone as needed for severe pain. ACE wrap for compression to help with the swelling. Use the immobilizer only if needed - don't rely on this as it can increase the stiffness. Icing 15 minutes at a time 3-4 times a day. If you're not improving call me Thursday afternoon or Friday morning and we can do a cortisone injection.

## 2017-04-29 NOTE — Assessment & Plan Note (Signed)
fluid and history consistent with flare of his rheumatoid arthritis.  We discussed options - he will take oral prednisone pack (he did report soreness of neck, back also) over this week and return Friday if not improving still for intraarticular cortisone injection.  Icing, compression, elevation.  Hydrocodone if needed.

## 2017-04-29 NOTE — Progress Notes (Signed)
PCP: Patient, No Pcp Per  Subjective:   HPI: Patient is a 32 y.o. male here for right knee pain.  Patient has been seen by us previously for his left knee, known rheumatoid arthritis. He has done well the past couple years but about 1-2 weeks ago started to get pain and swelling anterior right knee. No new injury or truama. Pain level to 8/10 level, sharp. He is using an immobilizer now and started taking prednisone today.   Went to ED twice - first time given immobilizer, crutches, and IM solumedrol. Aspiration was performed 2 days ago but without injection - fluid without crystals or evidence of infection but cell count over 12K. He has been taking hydrocodone as needed. No icing or wrapping the knee. No skin changes, numbness.  Past Medical History:  Diagnosis Date  . RA (rheumatoid arthritis) (HCC)     Current Outpatient Medications on File Prior to Visit  Medication Sig Dispense Refill  . HYDROcodone-acetaminophen (NORCO) 5-325 MG tablet Take 2 tablets by mouth every 4 (four) hours as needed (for pain). 20 tablet 0  . predniSONE (DELTASONE) 20 MG tablet Take 2 tablets (40 mg total) by mouth daily. 10 tablet 0   No current facility-administered medications on file prior to visit.     History reviewed. No pertinent surgical history.  No Known Allergies  Social History   Socioeconomic History  . Marital status: Single    Spouse name: Not on file  . Number of children: Not on file  . Years of education: Not on file  . Highest education level: Not on file  Social Needs  . Financial resource strain: Not on file  . Food insecurity - worry: Not on file  . Food insecurity - inability: Not on file  . Transportation needs - medical: Not on file  . Transportation needs - non-medical: Not on file  Occupational History  . Not on file  Tobacco Use  . Smoking status: Current Every Day Smoker    Types: Cigarettes  . Smokeless tobacco: Never Used  . Tobacco comment: 7  cigarettes per day  Substance and Sexual Activity  . Alcohol use: Yes    Comment: occ  . Drug use: No  . Sexual activity: Not on file  Other Topics Concern  . Not on file  Social History Narrative  . Not on file    History reviewed. No pertinent family history.  BP 133/87   Pulse 93   Ht 5\' 9"  (1.753 m)   Wt 154 lb (69.9 kg)   BMI 22.74 kg/m   Review of Systems: See HPI above.     Objective:  Physical Exam:  Gen: NAD, comfortable in exam room  Right knee: No gross deformity, ecchymoses.  Mod effusion, warmth but no erythema. TTP suprapatellar area, less medial joint line, anterior knee, popliteal fossa. ROM 0 - 90 degrees, painful with motion. Negative ant/post drawers. Negative valgus/varus testing. Negative lachmanns. Pain with mcmurrays and apleys but no click.  Negative patellar apprehension. NV intact distally.  Left knee: No deformity. FROM with 5/5 strength. No tenderness to palpation. NVI distally.   Assessment & Plan:  1. Right knee pain with effusion - fluid and history consistent with flare of his rheumatoid arthritis.  We discussed options - he will take oral prednisone pack (he did report soreness of neck, back also) over this week and return Friday if not improving still for intraarticular cortisone injection.  Icing, compression, elevation.  Hydrocodone if needed.

## 2017-05-01 LAB — BODY FLUID CULTURE: CULTURE: NO GROWTH

## 2017-05-02 ENCOUNTER — Encounter: Payer: Self-pay | Admitting: Family Medicine

## 2017-05-02 ENCOUNTER — Ambulatory Visit (INDEPENDENT_AMBULATORY_CARE_PROVIDER_SITE_OTHER): Payer: Self-pay | Admitting: Family Medicine

## 2017-05-02 DIAGNOSIS — M25561 Pain in right knee: Secondary | ICD-10-CM

## 2017-05-02 MED ORDER — METHYLPREDNISOLONE ACETATE 40 MG/ML IJ SUSP
40.0000 mg | Freq: Once | INTRAMUSCULAR | Status: AC
  Administered 2017-05-02: 40 mg via INTRA_ARTICULAR

## 2017-05-02 NOTE — Assessment & Plan Note (Signed)
2/2 flare of rheumatoid arthritis.  Not improving with prednisone.  Aspiration and injection performed today.  Elevation, icing.  Call us in a week to let us know how he's doing.  After informed written consent timeout was performed, patient was lying supine on exam table.  Right knee was prepped with alcohol swab.  Utilizing superolateral approach with ultrasound guidance, 3 mL of bupivicaine was used for local anesthesia.  Then using an 18g needle on 60cc syringe, 25 mL of cloudy straw-colored fluid was aspirated from right knee.  Knee was then injected with 3:1 bupivicaine:depomedrol.  Patient tolerated procedure well without immediate complications.

## 2017-05-02 NOTE — Progress Notes (Signed)
PCP: Patient, No Pcp Per  Subjective:   HPI: Patient is a 32 y.o. male here for right knee pain.  1/28: Patient has been seen by [place] previously for his left knee, known rheumatoid arthritis. He has done well the past couple years but about 1-2 weeks ago started to get pain and swelling anterior right knee. No new injury or truama. Pain level to 8/10 level, sharp. He is using an immobilizer now and started taking prednisone today.   Went to ED twice - first time given immobilizer, crutches, and IM solumedrol. Aspiration was performed 2 days ago but without injection - fluid without crystals or evidence of infection but cell count over 12K. He has been taking hydrocodone as needed. No icing or wrapping the knee. No skin changes, numbness.  1/31: Patient returns with continued right knee pain and swelling. Pain level 10/10 and sharp, would like to go ahead with injection.  Past Medical History:  Diagnosis Date  . RA (rheumatoid arthritis) (HCC)     Current Outpatient Medications on File Prior to Visit  Medication Sig Dispense Refill  . HYDROcodone-acetaminophen (NORCO) 5-325 MG tablet Take 2 tablets by mouth every 4 (four) hours as needed (for pain). 20 tablet 0  . predniSONE (DELTASONE) 20 MG tablet Take 2 tablets (40 mg total) by mouth daily. 10 tablet 0   No current facility-administered medications on file prior to visit.     History reviewed. No pertinent surgical history.  No Known Allergies  Social History   Socioeconomic History  . Marital status: Single    Spouse name: Not on file  . Number of children: Not on file  . Years of education: Not on file  . Highest education level: Not on file  Social Needs  . Financial resource strain: Not on file  . Food insecurity - worry: Not on file  . Food insecurity - inability: Not on file  . Transportation needs - medical: Not on file  . Transportation needs - non-medical: Not on file  Occupational History  . Not on  file  Tobacco Use  . Smoking status: Current Every Day Smoker    Types: Cigarettes  . Smokeless tobacco: Never Used  . Tobacco comment: 7 cigarettes per day  Substance and Sexual Activity  . Alcohol use: Yes    Comment: occ  . Drug use: No  . Sexual activity: Not on file  Other Topics Concern  . Not on file  Social History Narrative  . Not on file    History reviewed. No pertinent family history.  BP 134/80   Pulse 80   Ht 5\' 9"  (1.753 m)   Wt 154 lb (69.9 kg)   BMI 22.74 kg/m   Review of Systems: See HPI above.     Objective:  Physical Exam:  Gen: NAD, comfortable in exam room.  Exam not repeated today - noted effusion of right knee confirmed with ultrasound.  Right knee: No gross deformity, ecchymoses.  Mod effusion, warmth but no erythema. TTP suprapatellar area, less medial joint line, anterior knee, popliteal fossa. ROM 0 - 90 degrees, painful with motion. Negative ant/post drawers. Negative valgus/varus testing. Negative lachmanns. Pain with mcmurrays and apleys but no click.  Negative patellar apprehension. NV intact distally.  Left knee: No deformity. FROM with 5/5 strength. No tenderness to palpation. NVI distally.   Assessment & Plan:  1. Right knee pain with effusion - 2/2 flare of rheumatoid arthritis.  Not improving with prednisone.  Aspiration and injection  performed today.  Elevation, icing.  Call us in a week to let us know how he's doing.  After informed written consent timeout was performed, patient was lying supine on exam table.  Right knee was prepped with alcohol swab.  Utilizing superolateral approach with ultrasound guidance, 3 mL of bupivicaine was used for local anesthesia.  Then using an 18g needle on 60cc syringe, 25 mL of cloudy straw-colored fluid was aspirated from right knee.  Knee was then injected with 3:1 bupivicaine:depomedrol.  Patient tolerated procedure well without immediate complications.

## 2017-05-02 NOTE — Patient Instructions (Signed)
Call me and let me know how you're doing in about a week.

## 2017-05-09 ENCOUNTER — Ambulatory Visit (INDEPENDENT_AMBULATORY_CARE_PROVIDER_SITE_OTHER): Payer: Self-pay | Admitting: Family Medicine

## 2017-05-09 ENCOUNTER — Encounter: Payer: Self-pay | Admitting: Family Medicine

## 2017-05-09 DIAGNOSIS — M25561 Pain in right knee: Secondary | ICD-10-CM

## 2017-05-09 MED ORDER — PREDNISONE 10 MG PO TABS: ORAL_TABLET | ORAL | 0 refills | Status: AC

## 2017-05-09 NOTE — Assessment & Plan Note (Signed)
due to flare of rheumatoid arthritis.  1 week out from aspiration/injection and exam is much improved - no warmth and effusion is less.  No evidence infection.  Discussed options - will try extended course of prednisone for 12 days.  Icing, tylenol.  Encouraged motion exercises of knee and ankle as stiffness likely contributing to his pain.  F/u in 1 week.

## 2017-05-09 NOTE — Progress Notes (Signed)
PCP: Patient, No Pcp Per  Subjective:   HPI: Patient is a 32 y.o. male here for right knee pain.  1/28: Patient has been seen by us previously for his left knee, known rheumatoid arthritis. He has done well the past couple years but about 1-2 weeks ago started to get pain and swelling anterior right knee. No new injury or truama. Pain level to 8/10 level, sharp. He is using an immobilizer now and started taking prednisone today.   Went to ED twice - first time given immobilizer, crutches, and IM solumedrol. Aspiration was performed 2 days ago but without injection - fluid without crystals or evidence of infection but cell count over 12K. He has been taking hydrocodone as needed. No icing or wrapping the knee. No skin changes, numbness.  1/31: Patient returns with continued right knee pain and swelling. Pain level 10/10 and sharp, would like to go ahead with injection.  2/7: Patient reports he's had some improvement since last visit but still a lot of pain up to 10/10, stiff. Not taking any medicines for this now. Swelling improved some. No skin changes, fever, numbness.  Past Medical History:  Diagnosis Date  . RA (rheumatoid arthritis) (HCC)     Current Outpatient Medications on File Prior to Visit  Medication Sig Dispense Refill  . HYDROcodone-acetaminophen (NORCO) 5-325 MG tablet Take 2 tablets by mouth every 4 (four) hours as needed (for pain). 20 tablet 0   No current facility-administered medications on file prior to visit.     History reviewed. No pertinent surgical history.  No Known Allergies  Social History   Socioeconomic History  . Marital status: Single    Spouse name: Not on file  . Number of children: Not on file  . Years of education: Not on file  . Highest education level: Not on file  Social Needs  . Financial resource strain: Not on file  . Food insecurity - worry: Not on file  . Food insecurity - inability: Not on file  . Transportation  needs - medical: Not on file  . Transportation needs - non-medical: Not on file  Occupational History  . Not on file  Tobacco Use  . Smoking status: Current Every Day Smoker    Types: Cigarettes  . Smokeless tobacco: Never Used  . Tobacco comment: 7 cigarettes per day  Substance and Sexual Activity  . Alcohol use: Yes    Comment: occ  . Drug use: No  . Sexual activity: Not on file  Other Topics Concern  . Not on file  Social History Narrative  . Not on file    History reviewed. No pertinent family history.  BP 118/79   Pulse 82   Ht 5\' 9"  (1.753 m)   Wt 154 lb (69.9 kg)   BMI 22.74 kg/m   Review of Systems: See HPI above.     Objective:  Physical Exam:  Gen: NAD, comfortable in exam room.  Right knee: Mild effusion.  No warmth or erythema.  No bruising. TTP suprapatellar pouch, medial and lateral joint lines. ROM 0 - 30 degrees, stiff. Negative valgus/varus.   NVI distally.   Assessment & Plan:  1. Right knee pain with effusion - due to flare of rheumatoid arthritis.  1 week out from aspiration/injection and exam is much improved - no warmth and effusion is less.  No evidence infection.  Discussed options - will try extended course of prednisone for 12 days.  Icing, tylenol.  Encouraged motion exercises of  knee and ankle as stiffness likely contributing to his pain.  F/u in 1 week.

## 2017-05-09 NOTE — Patient Instructions (Signed)
Take the extended course of prednisone for the next 12 days - finish this even if you're feeling better. Ice the knee 15 minutes at a time 3-4 times a day. Tylenol is ok to take with this. Start doing motion exercises of your ankle and your knee. Follow up with me in 1 week for reevaluation.

## 2017-05-16 ENCOUNTER — Encounter: Payer: Self-pay | Admitting: Family Medicine

## 2017-05-16 ENCOUNTER — Ambulatory Visit (INDEPENDENT_AMBULATORY_CARE_PROVIDER_SITE_OTHER): Payer: Self-pay | Admitting: Family Medicine

## 2017-05-16 DIAGNOSIS — M25561 Pain in right knee: Secondary | ICD-10-CM

## 2017-05-16 NOTE — Assessment & Plan Note (Signed)
Clinically improving.  Continue his prednisone dose pack until completed then transition to regular aleve or ibuprofen.  Icing, compression, elevation, motion exercises and quad strengthening.  He will get cone coverage and plan to refer to rheumatology for his rheumatoid arthritis.

## 2017-05-16 NOTE — Progress Notes (Signed)
PCP: Patient, No Pcp Per  Subjective:   HPI: Patient is a 32 y.o. male here for right knee pain.  1/28: Patient has been seen by [place] previously for his left knee, known rheumatoid arthritis. He has done well the past couple years but about 1-2 weeks ago started to get pain and swelling anterior right knee. No new injury or truama. Pain level to 8/10 level, sharp. He is using an immobilizer now and started taking prednisone today.   Went to ED twice - first time given immobilizer, crutches, and IM solumedrol. Aspiration was performed 2 days ago but without injection - fluid without crystals or evidence of infection but cell count over 12K. He has been taking hydrocodone as needed. No icing or wrapping the knee. No skin changes, numbness.  1/31: Patient returns with continued right knee pain and swelling. Pain level 10/10 and sharp, would like to go ahead with injection.  2/7: Patient reports he's had some improvement since last visit but still a lot of pain up to 10/10, stiff. Not taking any medicines for this now. Swelling improved some. No skin changes, fever, numbness.  2/14: Patient reports he continues to improve. Pain is 8/10 and sharp at worst anterior knee. Some swelling still. Better motion of the knee. Tolerating the extended prednisone pack. No skin changes, fever, numbness.  Past Medical History:  Diagnosis Date  . RA (rheumatoid arthritis) (HCC)     Current Outpatient Medications on File Prior to Visit  Medication Sig Dispense Refill  . HYDROcodone-acetaminophen (NORCO) 5-325 MG tablet Take 2 tablets by mouth every 4 (four) hours as needed (for pain). 20 tablet 0  . predniSONE (DELTASONE) 10 MG tablet 6 tabs po days 1-2, 5 tabs po days 3-4, 4 tabs po days 5-6, 3 tabs po days 7-8, 2 tabs po days 9-10, 1 tab po days 11-12 42 tablet 0   No current facility-administered medications on file prior to visit.     History reviewed. No pertinent surgical  history.  No Known Allergies  Social History   Socioeconomic History  . Marital status: Single    Spouse name: Not on file  . Number of children: Not on file  . Years of education: Not on file  . Highest education level: Not on file  Social Needs  . Financial resource strain: Not on file  . Food insecurity - worry: Not on file  . Food insecurity - inability: Not on file  . Transportation needs - medical: Not on file  . Transportation needs - non-medical: Not on file  Occupational History  . Not on file  Tobacco Use  . Smoking status: Current Every Day Smoker    Types: Cigarettes  . Smokeless tobacco: Never Used  . Tobacco comment: 7 cigarettes per day  Substance and Sexual Activity  . Alcohol use: Yes    Comment: occ  . Drug use: No  . Sexual activity: Not on file  Other Topics Concern  . Not on file  Social History Narrative  . Not on file    History reviewed. No pertinent family history.  BP (!) 131/92   Pulse 80   Ht 5\' 9"  (1.753 m)   Wt 154 lb (69.9 kg)   BMI 22.74 kg/m   Review of Systems: See HPI above.     Objective:  Physical Exam:  Gen: NAD, comfortable in exam room.  Right knee: Mild effusion.  No warmth, other deformity. Mild TTP suprapatellar pouch. ROM 0 - 90 degrees  with 5/5 strength. Negative ant/post drawers. Negative valgus/varus testing. Negative lachmanns. Negative mcmurrays, apleys, patellar apprehension. NV intact distally.   Assessment & Plan:  1. Right knee pain with effusion - Clinically improving.  Continue his prednisone dose pack until completed then transition to regular aleve or ibuprofen.  Icing, compression, elevation, motion exercises and quad strengthening.  He will get cone coverage and plan to refer to rheumatology for his rheumatoid arthritis.

## 2017-05-16 NOTE — Patient Instructions (Signed)
Finish out the prednisone. After you finish this you should take aleve 2 tabs twice a day with food OR ibuprofen 600mg  three times a day with food regularly for at least 3-4 weeks. Icing, compression, elevation to help with swelling. Follow up with me as needed but call me when the cone coverage gets approved so we can refer you to the rheumatologist.

## 2018-08-17 ENCOUNTER — Other Ambulatory Visit: Payer: Self-pay

## 2018-08-17 ENCOUNTER — Encounter (HOSPITAL_COMMUNITY): Payer: Self-pay | Admitting: Emergency Medicine

## 2018-08-17 ENCOUNTER — Emergency Department (HOSPITAL_COMMUNITY)
Admission: EM | Admit: 2018-08-17 | Discharge: 2018-08-17 | Disposition: A | Discharge status: Home / Self Care | Payer: Self-pay | Attending: Emergency Medicine | Admitting: Emergency Medicine

## 2018-08-17 DIAGNOSIS — Y9389 Activity, other specified: Secondary | ICD-10-CM | POA: Insufficient documentation

## 2018-08-17 DIAGNOSIS — S81812A Laceration without foreign body, left lower leg, initial encounter: Secondary | ICD-10-CM | POA: Insufficient documentation

## 2018-08-17 DIAGNOSIS — Z23 Encounter for immunization: Secondary | ICD-10-CM | POA: Insufficient documentation

## 2018-08-17 DIAGNOSIS — F17210 Nicotine dependence, cigarettes, uncomplicated: Secondary | ICD-10-CM | POA: Insufficient documentation

## 2018-08-17 DIAGNOSIS — W268XXA Contact with other sharp object(s), not elsewhere classified, initial encounter: Secondary | ICD-10-CM | POA: Insufficient documentation

## 2018-08-17 DIAGNOSIS — Y9289 Other specified places as the place of occurrence of the external cause: Secondary | ICD-10-CM | POA: Insufficient documentation

## 2018-08-17 DIAGNOSIS — Y999 Unspecified external cause status: Secondary | ICD-10-CM | POA: Insufficient documentation

## 2018-08-17 MED ORDER — BACITRACIN ZINC 500 UNIT/GM EX OINT
TOPICAL_OINTMENT | Freq: Two times a day (BID) | CUTANEOUS | Status: DC
  Administered 2018-08-17: 1 via TOPICAL
  Filled 2018-08-17: qty 0.9

## 2018-08-17 MED ORDER — LIDOCAINE HCL 2 % IJ SOLN
INTRAMUSCULAR | Status: AC
  Filled 2018-08-17: qty 20

## 2018-08-17 MED ORDER — TETANUS-DIPHTH-ACELL PERTUSSIS 5-2.5-18.5 LF-MCG/0.5 IM SUSP
0.5000 mL | Freq: Once | INTRAMUSCULAR | Status: AC
  Administered 2018-08-17: 0.5 mL via INTRAMUSCULAR
  Filled 2018-08-17: qty 0.5
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
## 2018-08-17 NOTE — ED Notes (Signed)
Pt ambulated to room from triage without difficulty.  

## 2018-08-17 NOTE — ED Notes (Signed)
Bed: OM35 Expected date:  Expected time:  Means of arrival:  Comments: Zap at 2225

## 2018-08-17 NOTE — ED Notes (Signed)
Pt given and verbalized understanding of d/c instructions and need for follow up with pcp. Told to return if s/s worsen. No further distress or questions upon ambulating out of department. 

## 2018-08-17 NOTE — ED Notes (Signed)
Provider at bedside performing sutures. Meds given per MAR. Pt tolerating well, NAD
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
## 2018-11-26 ENCOUNTER — Encounter (HOSPITAL_COMMUNITY): Payer: Self-pay

## 2018-11-26 ENCOUNTER — Emergency Department (HOSPITAL_COMMUNITY)
Admission: EM | Admit: 2018-11-26 | Discharge: 2018-11-26 | Disposition: A | Discharge status: Home / Self Care | Payer: Self-pay | Attending: Emergency Medicine | Admitting: Emergency Medicine

## 2018-11-26 DIAGNOSIS — F17210 Nicotine dependence, cigarettes, uncomplicated: Secondary | ICD-10-CM | POA: Insufficient documentation

## 2018-11-26 DIAGNOSIS — S51011A Laceration without foreign body of right elbow, initial encounter: Secondary | ICD-10-CM | POA: Insufficient documentation

## 2018-11-26 DIAGNOSIS — Y929 Unspecified place or not applicable: Secondary | ICD-10-CM | POA: Insufficient documentation

## 2018-11-26 DIAGNOSIS — W25XXXA Contact with sharp glass, initial encounter: Secondary | ICD-10-CM | POA: Insufficient documentation

## 2018-11-26 DIAGNOSIS — Y9389 Activity, other specified: Secondary | ICD-10-CM | POA: Insufficient documentation

## 2018-11-26 DIAGNOSIS — S41111A Laceration without foreign body of right upper arm, initial encounter: Secondary | ICD-10-CM

## 2018-11-26 DIAGNOSIS — Y998 Other external cause status: Secondary | ICD-10-CM | POA: Insufficient documentation

## 2018-11-26 MED ORDER — LIDOCAINE-EPINEPHRINE 2 %-1:100000 IJ SOLN
20.0000 mL | Freq: Once | INTRAMUSCULAR | Status: AC
  Administered 2018-11-26: 02:00:00 20 mL
  Filled 2018-11-26: qty 1

## 2018-11-26 NOTE — Discharge Instructions (Addendum)
Keep covered and dressed.  Apply antibiotic ointment.  Follow-up in 7 to 10 days for suture removal.
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
## 2019-09-05 IMAGING — DX DG KNEE COMPLETE 4+V*R*
4 series · 4 of 4 positions shown · non-contrast
Comparison: None.

CLINICAL DATA: Initial evaluation for acute right knee pain for 1
week. History of rheumatoid arthritis.

EXAM:
RIGHT KNEE - COMPLETE 4+ VIEW

[knee ap]
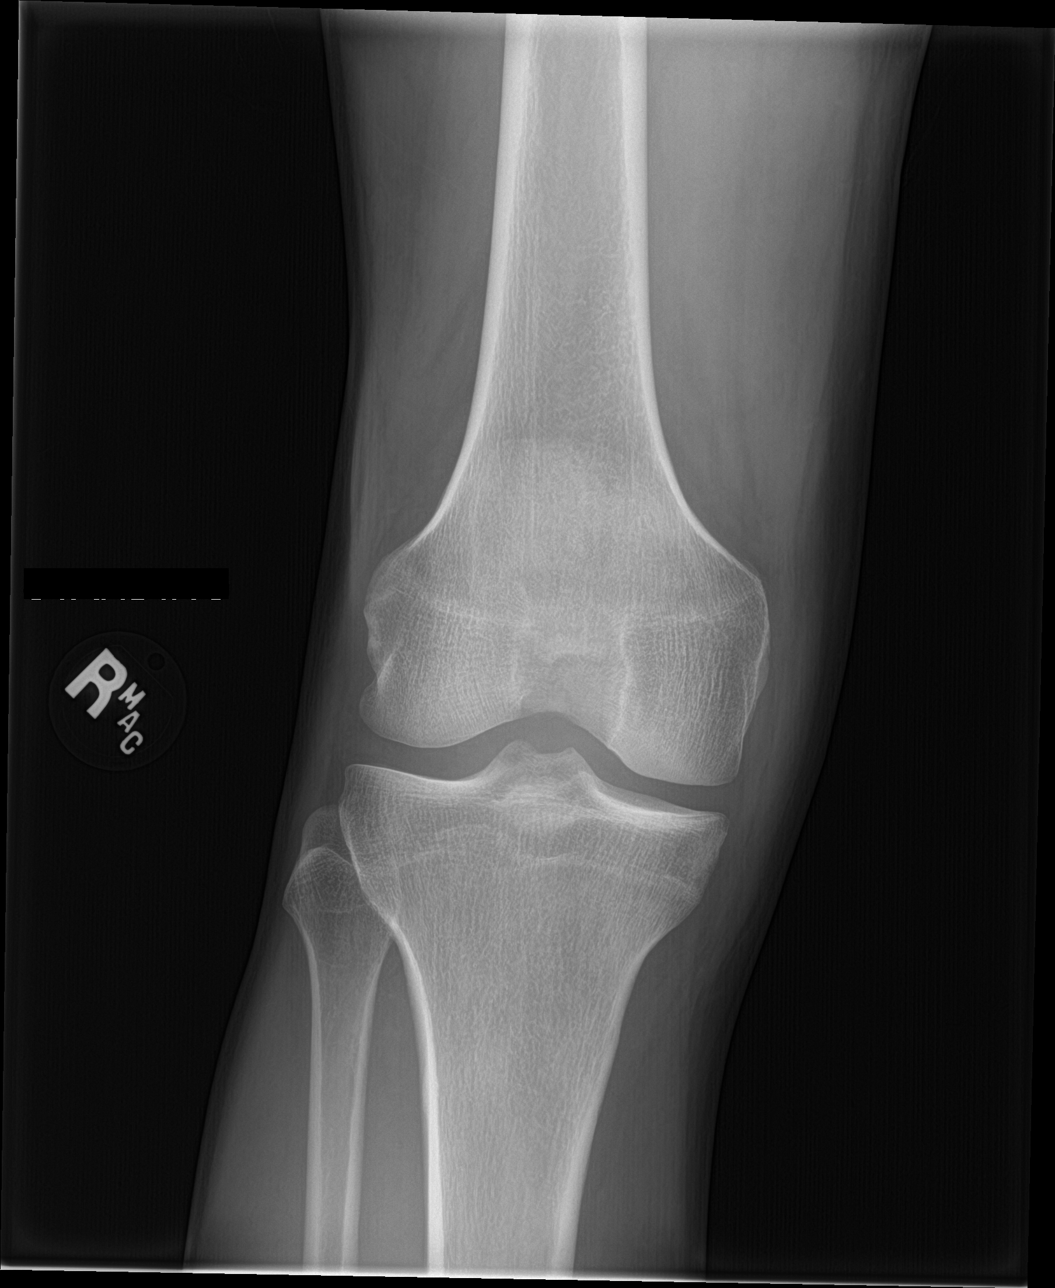

[tunnel]
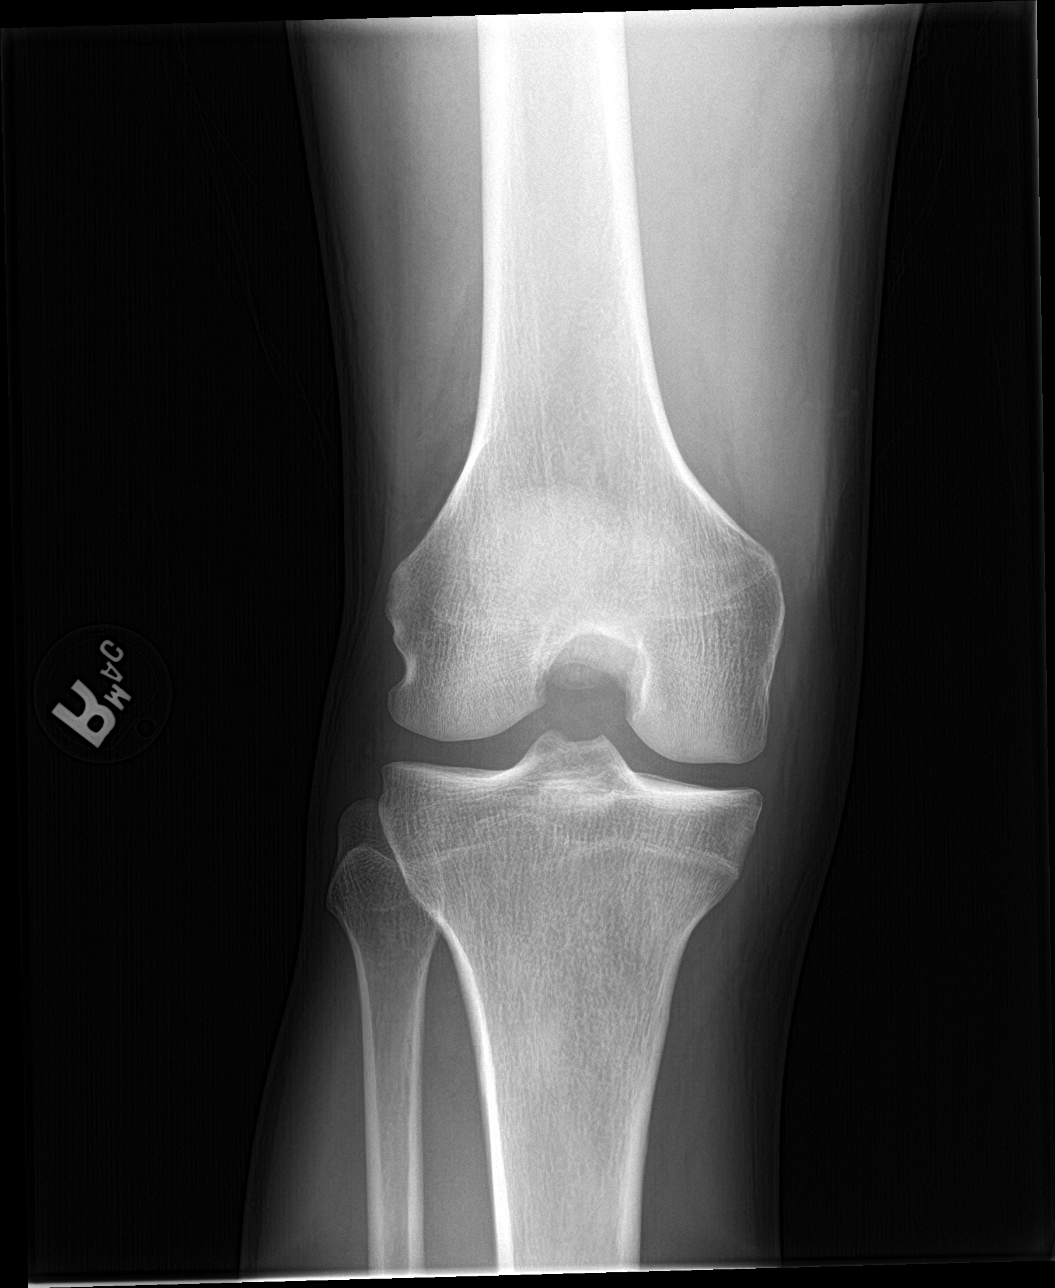

[knee lat]
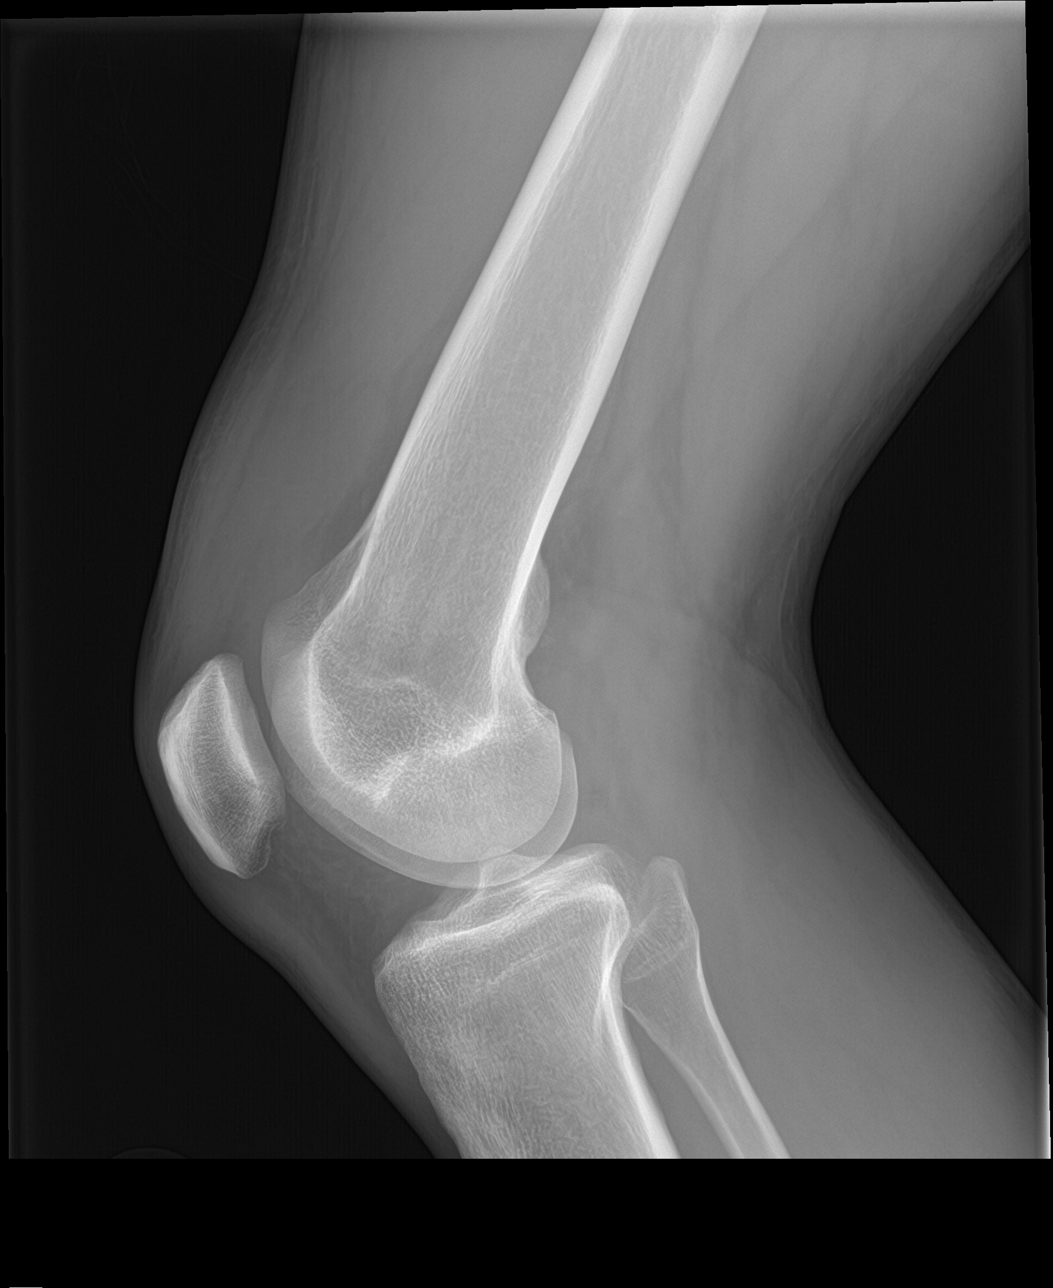

[knee sunrise]
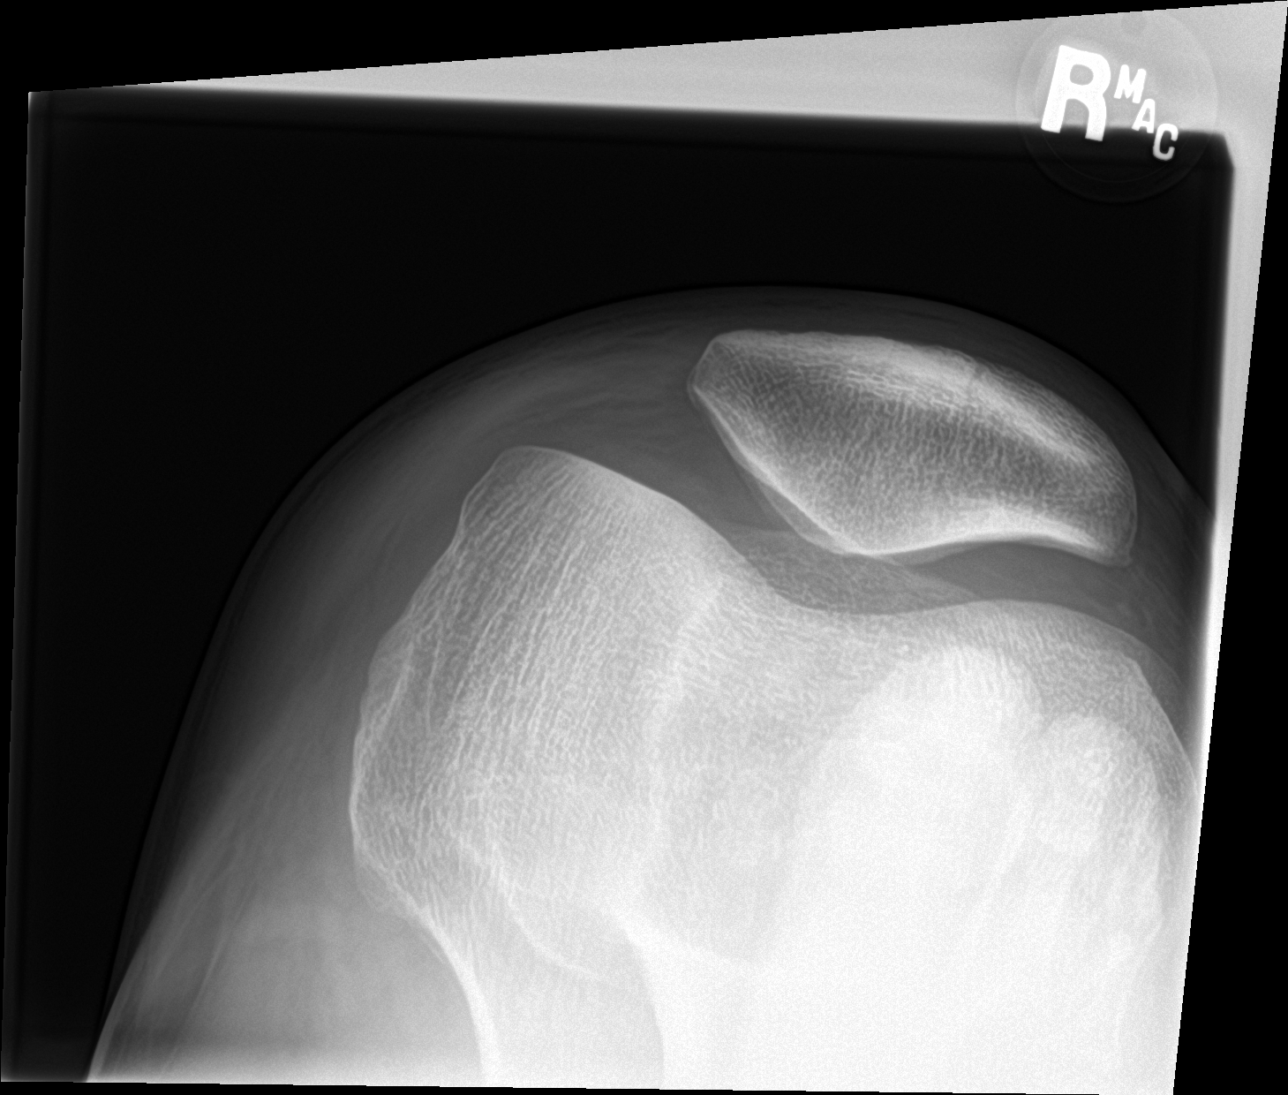

[4 of 4 positions shown; findings below may reference images not displayed]

FINDINGS: No acute fracture or dislocation. Joint spaces are well maintained
without evidence for significant degenerative or erosive
arthropathy. Small moderate joint effusion within the suprapatellar
recess. Osseous mineralization normal. No acute soft tissue
abnormality.
IMPRESSION: 1. No acute osseous abnormality about the right knee.
2. Small to moderate joint effusion within the suprapatellar recess.
